# Patient Record
Sex: Male | Born: 1940 | Race: White | Hispanic: No | Marital: Married | Smoking: Former smoker
Health system: Southern US, Community
[De-identification: ages and names within clinical notes are randomized; demographics above are authoritative.]

---

## 2011-10-17 ENCOUNTER — Other Ambulatory Visit: Payer: Self-pay | Admitting: Neurological Surgery

## 2011-10-17 DIAGNOSIS — M542 Cervicalgia: Secondary | ICD-10-CM

## 2011-10-24 ENCOUNTER — Ambulatory Visit
Admission: RE | Admit: 2011-10-24 | Discharge: 2011-10-24 | Disposition: A | Discharge status: Home / Self Care | Payer: Medicare Other | Source: Ambulatory Visit | Attending: Neurological Surgery | Admitting: Neurological Surgery

## 2011-10-24 VITALS — BP 137/72 | HR 80 | Ht 65.0 in | Wt 195.0 lb

## 2011-10-24 DIAGNOSIS — M542 Cervicalgia: Secondary | ICD-10-CM

## 2011-10-24 MED ORDER — ONDANSETRON HCL 4 MG/2ML IJ SOLN
4.0000 mg | Freq: Once | INTRAMUSCULAR | Status: AC
  Administered 2011-10-24: 4 mg via INTRAMUSCULAR

## 2011-10-24 MED ORDER — MEPERIDINE HCL 100 MG/ML IJ SOLN
75.0000 mg | Freq: Once | INTRAMUSCULAR | Status: AC
  Administered 2011-10-24: 50 mg via INTRAMUSCULAR

## 2011-10-24 MED ORDER — IOHEXOL 300 MG/ML  SOLN
10.0000 mL | Freq: Once | INTRAMUSCULAR | Status: AC | PRN
  Administered 2011-10-24: 10 mL via INTRATHECAL

## 2011-10-24 MED ORDER — MEPERIDINE HCL 100 MG/ML IJ SOLN
75.0000 mg | Freq: Once | INTRAMUSCULAR | Status: AC
  Administered 2011-10-24: 75 mg via INTRAMUSCULAR

## 2011-10-24 NOTE — Progress Notes (Signed)
Pt states he has been off trazadone for the past 2 days.

## 2011-10-25 ENCOUNTER — Telehealth: Payer: Self-pay | Admitting: Radiology

## 2011-10-25 NOTE — Telephone Encounter (Signed)
Wife called because pt is weak and lethargic, wife gave pt his trazadone last evening even though it was to be held. Pt also receives large doses of morphine and hydrocodone around the clock. Explained this seemed to be a med problem not a myelogram problem.

## 2012-07-06 DIAGNOSIS — C679 Malignant neoplasm of bladder, unspecified: Secondary | ICD-10-CM | POA: Insufficient documentation

## 2013-02-12 DIAGNOSIS — Z515 Encounter for palliative care: Secondary | ICD-10-CM | POA: Insufficient documentation

## 2013-08-06 DIAGNOSIS — M4310 Spondylolisthesis, site unspecified: Secondary | ICD-10-CM | POA: Insufficient documentation

## 2013-08-06 DIAGNOSIS — J309 Allergic rhinitis, unspecified: Secondary | ICD-10-CM | POA: Insufficient documentation

## 2013-08-06 DIAGNOSIS — E669 Obesity, unspecified: Secondary | ICD-10-CM | POA: Insufficient documentation

## 2013-08-06 DIAGNOSIS — I878 Other specified disorders of veins: Secondary | ICD-10-CM | POA: Insufficient documentation

## 2013-08-06 DIAGNOSIS — J45901 Unspecified asthma with (acute) exacerbation: Secondary | ICD-10-CM | POA: Insufficient documentation

## 2013-08-06 DIAGNOSIS — M19019 Primary osteoarthritis, unspecified shoulder: Secondary | ICD-10-CM | POA: Insufficient documentation

## 2013-08-06 DIAGNOSIS — R490 Dysphonia: Secondary | ICD-10-CM | POA: Insufficient documentation

## 2013-08-06 DIAGNOSIS — R7309 Other abnormal glucose: Secondary | ICD-10-CM | POA: Insufficient documentation

## 2013-08-06 DIAGNOSIS — M19079 Primary osteoarthritis, unspecified ankle and foot: Secondary | ICD-10-CM | POA: Insufficient documentation

## 2013-08-06 DIAGNOSIS — R0902 Hypoxemia: Secondary | ICD-10-CM | POA: Insufficient documentation

## 2013-08-06 DIAGNOSIS — R310 Gross hematuria: Secondary | ICD-10-CM | POA: Insufficient documentation

## 2013-08-06 DIAGNOSIS — M5412 Radiculopathy, cervical region: Secondary | ICD-10-CM | POA: Insufficient documentation

## 2013-08-06 DIAGNOSIS — M2040 Other hammer toe(s) (acquired), unspecified foot: Secondary | ICD-10-CM | POA: Insufficient documentation

## 2013-08-06 DIAGNOSIS — M659 Synovitis and tenosynovitis, unspecified: Secondary | ICD-10-CM | POA: Insufficient documentation

## 2013-08-06 DIAGNOSIS — G9612 Meningeal adhesions (cerebral) (spinal): Secondary | ICD-10-CM | POA: Insufficient documentation

## 2013-08-06 DIAGNOSIS — G894 Chronic pain syndrome: Secondary | ICD-10-CM | POA: Insufficient documentation

## 2013-08-06 DIAGNOSIS — K219 Gastro-esophageal reflux disease without esophagitis: Secondary | ICD-10-CM | POA: Insufficient documentation

## 2013-08-06 DIAGNOSIS — H903 Sensorineural hearing loss, bilateral: Secondary | ICD-10-CM | POA: Insufficient documentation

## 2013-08-06 DIAGNOSIS — S43003A Unspecified subluxation of unspecified shoulder joint, initial encounter: Secondary | ICD-10-CM | POA: Insufficient documentation

## 2013-08-06 DIAGNOSIS — M1991 Primary osteoarthritis, unspecified site: Secondary | ICD-10-CM | POA: Insufficient documentation

## 2013-08-26 DIAGNOSIS — M5414 Radiculopathy, thoracic region: Secondary | ICD-10-CM | POA: Insufficient documentation

## 2013-08-26 DIAGNOSIS — M5417 Radiculopathy, lumbosacral region: Secondary | ICD-10-CM

## 2013-10-03 DIAGNOSIS — M47816 Spondylosis without myelopathy or radiculopathy, lumbar region: Secondary | ICD-10-CM | POA: Insufficient documentation

## 2013-10-03 DIAGNOSIS — M533 Sacrococcygeal disorders, not elsewhere classified: Secondary | ICD-10-CM | POA: Insufficient documentation

## 2013-10-03 DIAGNOSIS — M5136 Other intervertebral disc degeneration, lumbar region: Secondary | ICD-10-CM | POA: Insufficient documentation

## 2013-10-09 DIAGNOSIS — R8289 Other abnormal findings on cytological and histological examination of urine: Secondary | ICD-10-CM | POA: Insufficient documentation

## 2013-10-09 DIAGNOSIS — N329 Bladder disorder, unspecified: Secondary | ICD-10-CM | POA: Insufficient documentation

## 2013-10-18 DIAGNOSIS — S3210XA Unspecified fracture of sacrum, initial encounter for closed fracture: Secondary | ICD-10-CM | POA: Insufficient documentation

## 2013-10-19 DIAGNOSIS — K5900 Constipation, unspecified: Secondary | ICD-10-CM | POA: Insufficient documentation

## 2013-10-19 DIAGNOSIS — J159 Unspecified bacterial pneumonia: Secondary | ICD-10-CM | POA: Insufficient documentation

## 2013-10-19 DIAGNOSIS — L89159 Pressure ulcer of sacral region, unspecified stage: Secondary | ICD-10-CM | POA: Insufficient documentation

## 2013-10-19 DIAGNOSIS — E876 Hypokalemia: Secondary | ICD-10-CM | POA: Insufficient documentation

## 2013-10-19 DIAGNOSIS — S51809A Unspecified open wound of unspecified forearm, initial encounter: Secondary | ICD-10-CM | POA: Insufficient documentation

## 2013-10-19 DIAGNOSIS — R600 Localized edema: Secondary | ICD-10-CM | POA: Insufficient documentation

## 2013-10-19 DIAGNOSIS — S90819A Abrasion, unspecified foot, initial encounter: Secondary | ICD-10-CM | POA: Insufficient documentation

## 2013-10-19 DIAGNOSIS — M25559 Pain in unspecified hip: Secondary | ICD-10-CM | POA: Insufficient documentation

## 2013-10-19 DIAGNOSIS — D619 Aplastic anemia, unspecified: Secondary | ICD-10-CM | POA: Insufficient documentation

## 2013-10-19 DIAGNOSIS — R413 Other amnesia: Secondary | ICD-10-CM | POA: Insufficient documentation

## 2013-11-21 ENCOUNTER — Other Ambulatory Visit: Payer: Self-pay | Admitting: Neurological Surgery

## 2013-11-21 DIAGNOSIS — S3210XG Unspecified fracture of sacrum, subsequent encounter for fracture with delayed healing: Secondary | ICD-10-CM

## 2013-11-25 ENCOUNTER — Inpatient Hospital Stay: Admission: RE | Admit: 2013-11-25 | Payer: Medicare Other | Source: Ambulatory Visit

## 2013-12-10 ENCOUNTER — Ambulatory Visit
Admission: RE | Admit: 2013-12-10 | Discharge: 2013-12-10 | Disposition: A | Discharge status: Home / Self Care | Payer: Medicare Other | Source: Ambulatory Visit | Attending: Neurological Surgery | Admitting: Neurological Surgery

## 2013-12-10 DIAGNOSIS — S3210XG Unspecified fracture of sacrum, subsequent encounter for fracture with delayed healing: Secondary | ICD-10-CM

## 2013-12-13 ENCOUNTER — Other Ambulatory Visit: Payer: Self-pay | Admitting: Neurological Surgery

## 2013-12-13 DIAGNOSIS — S3210XK Unspecified fracture of sacrum, subsequent encounter for fracture with nonunion: Secondary | ICD-10-CM

## 2013-12-16 DIAGNOSIS — G8929 Other chronic pain: Secondary | ICD-10-CM | POA: Insufficient documentation

## 2013-12-16 DIAGNOSIS — J449 Chronic obstructive pulmonary disease, unspecified: Secondary | ICD-10-CM | POA: Insufficient documentation

## 2013-12-16 DIAGNOSIS — M549 Dorsalgia, unspecified: Secondary | ICD-10-CM

## 2013-12-19 ENCOUNTER — Ambulatory Visit
Admission: RE | Admit: 2013-12-19 | Discharge: 2013-12-19 | Disposition: A | Discharge status: Home / Self Care | Payer: Medicare Other | Source: Ambulatory Visit | Attending: Neurological Surgery | Admitting: Neurological Surgery

## 2013-12-19 VITALS — BP 148/84 | HR 72 | Temp 98.1°F | Resp 16

## 2013-12-19 DIAGNOSIS — S3210XG Unspecified fracture of sacrum, subsequent encounter for fracture with delayed healing: Secondary | ICD-10-CM

## 2013-12-19 MED ORDER — SODIUM CHLORIDE 0.9 % IV SOLN
Freq: Once | INTRAVENOUS | Status: AC
  Administered 2013-12-19: 08:00:00 via INTRAVENOUS

## 2013-12-19 MED ORDER — CEFAZOLIN SODIUM-DEXTROSE 2-3 GM-% IV SOLR
2.0000 g | Freq: Once | INTRAVENOUS | Status: AC
  Administered 2013-12-19: 2 g via INTRAVENOUS

## 2013-12-19 MED ORDER — FENTANYL CITRATE 0.05 MG/ML IJ SOLN
25.0000 ug | INTRAMUSCULAR | Status: DC | PRN
  Administered 2013-12-19 (×2): 25 ug via INTRAVENOUS

## 2013-12-19 MED ORDER — MIDAZOLAM HCL 2 MG/2ML IJ SOLN
1.0000 mg | INTRAMUSCULAR | Status: DC | PRN
  Administered 2013-12-19 (×2): 0.5 mg via INTRAVENOUS

## 2013-12-19 MED ORDER — KETOROLAC TROMETHAMINE 30 MG/ML IJ SOLN: 30.0000 mg | Freq: Once | INTRAMUSCULAR | Status: DC

## 2013-12-19 NOTE — Progress Notes (Signed)
Pt lungs are coarse thru out due to COPD. Sat on room air 91%. Skin warm and dry.

## 2013-12-19 NOTE — Discharge Instructions (Addendum)
Sacroplasty Post Procedure Discharge Instructions  1. May resume a regular diet and any medications that you routinely take (including pain medications). 2. No driving day of procedure. 3. Upon discharge go home and rest for at least 4 hours.  May use an ice pack as needed to injection sites on back. 4. Remove bandades after shower tomorrow and replace with bandaides daily until healed.    Please contact our office at [phone] for the following symptoms:   Fever greater than 100 degrees  Increased swelling, pain, or redness at injection site.   Thank you for visiting [facility].

## 2013-12-19 NOTE — Progress Notes (Signed)
Wife at bedside, Dr. Jobe Igo in to speak to pt and wife

## 2014-01-09 ENCOUNTER — Other Ambulatory Visit: Payer: Self-pay | Admitting: Neurological Surgery

## 2014-01-09 DIAGNOSIS — S3210XB Unspecified fracture of sacrum, initial encounter for open fracture: Secondary | ICD-10-CM

## 2014-01-22 ENCOUNTER — Other Ambulatory Visit: Payer: Medicare Other

## 2014-02-19 ENCOUNTER — Inpatient Hospital Stay
Admission: AD | Admit: 2014-02-19 | Discharge: 2014-02-28 | Disposition: E | Payer: Self-pay | Source: Ambulatory Visit | Attending: Internal Medicine | Admitting: Internal Medicine

## 2014-02-19 ENCOUNTER — Other Ambulatory Visit (HOSPITAL_COMMUNITY): Payer: Self-pay

## 2014-02-19 DIAGNOSIS — N179 Acute kidney failure, unspecified: Secondary | ICD-10-CM

## 2014-02-19 DIAGNOSIS — J111 Influenza due to unidentified influenza virus with other respiratory manifestations: Secondary | ICD-10-CM

## 2014-02-19 DIAGNOSIS — Z4659 Encounter for fitting and adjustment of other gastrointestinal appliance and device: Secondary | ICD-10-CM

## 2014-02-19 DIAGNOSIS — J9690 Respiratory failure, unspecified, unspecified whether with hypoxia or hypercapnia: Secondary | ICD-10-CM

## 2014-02-19 LAB — PROCALCITONIN: PROCALCITONIN: 1.79 ng/mL

## 2014-02-20 ENCOUNTER — Other Ambulatory Visit (HOSPITAL_COMMUNITY): Payer: Self-pay

## 2014-02-20 LAB — URINALYSIS, ROUTINE W REFLEX MICROSCOPIC
Bilirubin Urine: NEGATIVE
Glucose, UA: NEGATIVE mg/dL
Ketones, ur: NEGATIVE mg/dL
Nitrite: NEGATIVE
PROTEIN: 100 mg/dL — AB
SPECIFIC GRAVITY, URINE: 1.016 (ref 1.005–1.030)
UROBILINOGEN UA: 0.2 mg/dL (ref 0.0–1.0)
pH: 5.5 (ref 5.0–8.0)

## 2014-02-20 LAB — URINE MICROSCOPIC-ADD ON

## 2014-02-20 LAB — COMPREHENSIVE METABOLIC PANEL
ALK PHOS: 65 U/L (ref 39–117)
ALT: 47 U/L (ref 0–53)
AST: 37 U/L (ref 0–37)
Albumin: 2.3 g/dL — ABNORMAL LOW (ref 3.5–5.2)
Anion gap: 14 (ref 5–15)
BILIRUBIN TOTAL: 0.4 mg/dL (ref 0.3–1.2)
BUN: 78 mg/dL — AB (ref 6–23)
CHLORIDE: 105 meq/L (ref 96–112)
CO2: 19 mmol/L (ref 19–32)
Calcium: 8.6 mg/dL (ref 8.4–10.5)
Creatinine, Ser: 4.23 mg/dL — ABNORMAL HIGH (ref 0.50–1.35)
GFR calc Af Amer: 15 mL/min — ABNORMAL LOW (ref >90)
GFR calc non Af Amer: 13 mL/min — ABNORMAL LOW (ref >90)
Glucose, Bld: 111 mg/dL — ABNORMAL HIGH (ref 70–99)
POTASSIUM: 4.6 mmol/L (ref 3.5–5.1)
SODIUM: 138 mmol/L (ref 135–145)
Total Protein: 6.6 g/dL (ref 6.0–8.3)

## 2014-02-20 LAB — CBC WITH DIFFERENTIAL/PLATELET
BASOS PCT: 1 % (ref 0–1)
Basophils Absolute: 0.1 10*3/uL (ref 0.0–0.1)
EOS ABS: 0.1 10*3/uL (ref 0.0–0.7)
Eosinophils Relative: 2 % (ref 0–5)
HCT: 33.5 % — ABNORMAL LOW (ref 39.0–52.0)
Hemoglobin: 10.8 g/dL — ABNORMAL LOW (ref 13.0–17.0)
LYMPHS ABS: 0.9 10*3/uL (ref 0.7–4.0)
Lymphocytes Relative: 10 % — ABNORMAL LOW (ref 12–46)
MCH: 28.7 pg (ref 26.0–34.0)
MCHC: 32.2 g/dL (ref 30.0–36.0)
MCV: 89.1 fL (ref 78.0–100.0)
MONOS PCT: 20 % — AB (ref 3–12)
Monocytes Absolute: 1.8 10*3/uL — ABNORMAL HIGH (ref 0.1–1.0)
NEUTROS ABS: 6 10*3/uL (ref 1.7–7.7)
NEUTROS PCT: 67 % (ref 43–77)
PLATELETS: 435 10*3/uL — AB (ref 150–400)
RBC: 3.76 MIL/uL — ABNORMAL LOW (ref 4.22–5.81)
RDW: 14.9 % (ref 11.5–15.5)
WBC: 8.9 10*3/uL (ref 4.0–10.5)

## 2014-02-20 LAB — C-REACTIVE PROTEIN: CRP: 16.4 mg/dL — ABNORMAL HIGH (ref <0.60)

## 2014-02-20 LAB — LIPID PANEL
CHOL/HDL RATIO: 3.8 ratio
CHOLESTEROL: 157 mg/dL (ref 0–200)
HDL: 41 mg/dL (ref >39)
LDL Cholesterol: 95 mg/dL (ref 0–99)
TRIGLYCERIDES: 104 mg/dL (ref <150)
VLDL: 21 mg/dL (ref 0–40)

## 2014-02-20 LAB — CK: CK TOTAL: 22 U/L (ref 7–232)

## 2014-02-20 LAB — VITAMIN B12: VITAMIN B 12: 853 pg/mL (ref 211–911)

## 2014-02-20 LAB — T4, FREE: Free T4: 0.83 ng/dL (ref 0.80–1.80)

## 2014-02-20 LAB — BRAIN NATRIURETIC PEPTIDE: B NATRIURETIC PEPTIDE 5: 160.6 pg/mL — AB (ref 0.0–100.0)

## 2014-02-20 LAB — PROTIME-INR
INR: 1.23 (ref 0.00–1.49)
Prothrombin Time: 15.7 seconds — ABNORMAL HIGH (ref 11.6–15.2)

## 2014-02-20 LAB — PHOSPHORUS: Phosphorus: 5.5 mg/dL — ABNORMAL HIGH (ref 2.3–4.6)

## 2014-02-20 LAB — TSH: TSH: 1.282 u[IU]/mL (ref 0.350–4.500)

## 2014-02-20 LAB — SEDIMENTATION RATE: SED RATE: 92 mm/h — AB (ref 0–16)

## 2014-02-20 LAB — MAGNESIUM: Magnesium: 2.1 mg/dL (ref 1.5–2.5)

## 2014-02-20 LAB — FERRITIN: Ferritin: 717 ng/mL — ABNORMAL HIGH (ref 22–322)

## 2014-02-21 ENCOUNTER — Other Ambulatory Visit (HOSPITAL_COMMUNITY): Payer: Self-pay

## 2014-02-21 LAB — RENAL FUNCTION PANEL
ALBUMIN: 2.4 g/dL — AB (ref 3.5–5.2)
Anion gap: 15 (ref 5–15)
BUN: 88 mg/dL — ABNORMAL HIGH (ref 6–23)
CO2: 19 mmol/L (ref 19–32)
Calcium: 8.8 mg/dL (ref 8.4–10.5)
Chloride: 107 mEq/L (ref 96–112)
Creatinine, Ser: 4.96 mg/dL — ABNORMAL HIGH (ref 0.50–1.35)
GFR, EST AFRICAN AMERICAN: 12 mL/min — AB (ref >90)
GFR, EST NON AFRICAN AMERICAN: 11 mL/min — AB (ref >90)
Glucose, Bld: 107 mg/dL — ABNORMAL HIGH (ref 70–99)
Phosphorus: 6.1 mg/dL — ABNORMAL HIGH (ref 2.3–4.6)
Potassium: 5 mmol/L (ref 3.5–5.1)
Sodium: 141 mmol/L (ref 135–145)

## 2014-02-21 LAB — HEMOGLOBIN A1C
Hgb A1c MFr Bld: 5.9 % — ABNORMAL HIGH (ref <5.7)
Mean Plasma Glucose: 123 mg/dL — ABNORMAL HIGH (ref <117)

## 2014-02-21 LAB — CBC
HCT: 31.7 % — ABNORMAL LOW (ref 39.0–52.0)
Hemoglobin: 9.8 g/dL — ABNORMAL LOW (ref 13.0–17.0)
MCH: 28.3 pg (ref 26.0–34.0)
MCHC: 30.9 g/dL (ref 30.0–36.0)
MCV: 91.6 fL (ref 78.0–100.0)
PLATELETS: 436 10*3/uL — AB (ref 150–400)
RBC: 3.46 MIL/uL — AB (ref 4.22–5.81)
RDW: 15.3 % (ref 11.5–15.5)
WBC: 9.4 10*3/uL (ref 4.0–10.5)

## 2014-02-21 LAB — URINE CULTURE
CULTURE: NO GROWTH
Colony Count: NO GROWTH

## 2014-02-21 LAB — PROCALCITONIN: PROCALCITONIN: 1.71 ng/mL

## 2014-02-22 ENCOUNTER — Other Ambulatory Visit (HOSPITAL_COMMUNITY): Payer: Self-pay

## 2014-02-22 LAB — RENAL FUNCTION PANEL
ALBUMIN: 2.4 g/dL — AB (ref 3.5–5.2)
Anion gap: 13 (ref 5–15)
BUN: 96 mg/dL — ABNORMAL HIGH (ref 6–23)
CALCIUM: 8.6 mg/dL (ref 8.4–10.5)
CO2: 21 mmol/L (ref 19–32)
Chloride: 107 mEq/L (ref 96–112)
Creatinine, Ser: 5.8 mg/dL — ABNORMAL HIGH (ref 0.50–1.35)
GFR, EST AFRICAN AMERICAN: 10 mL/min — AB (ref >90)
GFR, EST NON AFRICAN AMERICAN: 9 mL/min — AB (ref >90)
Glucose, Bld: 134 mg/dL — ABNORMAL HIGH (ref 70–99)
PHOSPHORUS: 6.7 mg/dL — AB (ref 2.3–4.6)
Potassium: 5.1 mmol/L (ref 3.5–5.1)
SODIUM: 141 mmol/L (ref 135–145)

## 2014-02-22 LAB — C4 COMPLEMENT: COMPLEMENT C4, BODY FLUID: 27 mg/dL (ref 10–40)

## 2014-02-22 LAB — FOLATE RBC

## 2014-02-22 LAB — C3 COMPLEMENT: C3 Complement: 101 mg/dL (ref 90–180)

## 2014-02-23 LAB — RENAL FUNCTION PANEL
Albumin: 2.2 g/dL — ABNORMAL LOW (ref 3.5–5.2)
Anion gap: 10 (ref 5–15)
BUN: 104 mg/dL — ABNORMAL HIGH (ref 6–23)
CHLORIDE: 112 meq/L (ref 96–112)
CO2: 21 mmol/L (ref 19–32)
CREATININE: 6.1 mg/dL — AB (ref 0.50–1.35)
Calcium: 8.8 mg/dL (ref 8.4–10.5)
GFR calc Af Amer: 10 mL/min — ABNORMAL LOW (ref >90)
GFR calc non Af Amer: 8 mL/min — ABNORMAL LOW (ref >90)
GLUCOSE: 165 mg/dL — AB (ref 70–99)
PHOSPHORUS: 7.3 mg/dL — AB (ref 2.3–4.6)
POTASSIUM: 5 mmol/L (ref 3.5–5.1)
Sodium: 143 mmol/L (ref 135–145)

## 2014-02-23 LAB — CBC WITH DIFFERENTIAL/PLATELET
BASOS PCT: 0 % (ref 0–1)
Basophils Absolute: 0 10*3/uL (ref 0.0–0.1)
Eosinophils Absolute: 0 10*3/uL (ref 0.0–0.7)
Eosinophils Relative: 0 % (ref 0–5)
HCT: 30.5 % — ABNORMAL LOW (ref 39.0–52.0)
Hemoglobin: 9.8 g/dL — ABNORMAL LOW (ref 13.0–17.0)
LYMPHS PCT: 7 % — AB (ref 12–46)
Lymphs Abs: 0.9 10*3/uL (ref 0.7–4.0)
MCH: 29.5 pg (ref 26.0–34.0)
MCHC: 32.1 g/dL (ref 30.0–36.0)
MCV: 91.9 fL (ref 78.0–100.0)
MONO ABS: 2.2 10*3/uL — AB (ref 0.1–1.0)
MONOS PCT: 17 % — AB (ref 3–12)
Neutro Abs: 10 10*3/uL — ABNORMAL HIGH (ref 1.7–7.7)
Neutrophils Relative %: 76 % (ref 43–77)
PLATELETS: 485 10*3/uL — AB (ref 150–400)
RBC: 3.32 MIL/uL — ABNORMAL LOW (ref 4.22–5.81)
RDW: 15.3 % (ref 11.5–15.5)
WBC: 13.1 10*3/uL — AB (ref 4.0–10.5)

## 2014-02-23 LAB — PROCALCITONIN: PROCALCITONIN: 1.92 ng/mL

## 2014-02-24 LAB — RENAL FUNCTION PANEL
Albumin: 2 g/dL — ABNORMAL LOW (ref 3.5–5.2)
Anion gap: 10 (ref 5–15)
BUN: 121 mg/dL — AB (ref 6–23)
CO2: 19 mmol/L (ref 19–32)
Calcium: 8.6 mg/dL (ref 8.4–10.5)
Chloride: 116 mEq/L — ABNORMAL HIGH (ref 96–112)
Creatinine, Ser: 7.1 mg/dL — ABNORMAL HIGH (ref 0.50–1.35)
GFR calc Af Amer: 8 mL/min — ABNORMAL LOW (ref >90)
GFR calc non Af Amer: 7 mL/min — ABNORMAL LOW (ref >90)
GLUCOSE: 135 mg/dL — AB (ref 70–99)
PHOSPHORUS: 10.7 mg/dL — AB (ref 2.3–4.6)
POTASSIUM: 6.9 mmol/L — AB (ref 3.5–5.1)
Sodium: 145 mmol/L (ref 135–145)

## 2014-02-24 LAB — CBC
HEMATOCRIT: 30 % — AB (ref 39.0–52.0)
Hemoglobin: 9.2 g/dL — ABNORMAL LOW (ref 13.0–17.0)
MCH: 29.7 pg (ref 26.0–34.0)
MCHC: 30.7 g/dL (ref 30.0–36.0)
MCV: 96.8 fL (ref 78.0–100.0)
Platelets: 630 10*3/uL — ABNORMAL HIGH (ref 150–400)
RBC: 3.1 MIL/uL — ABNORMAL LOW (ref 4.22–5.81)
RDW: 15.7 % — ABNORMAL HIGH (ref 11.5–15.5)
WBC: 20.8 10*3/uL — ABNORMAL HIGH (ref 4.0–10.5)

## 2014-02-24 LAB — MPO/PR-3 (ANCA) ANTIBODIES
Myeloperoxidase Abs: <1
Serine Protease 3: <1

## 2014-02-24 LAB — CK: CK TOTAL: 25 U/L (ref 7–232)

## 2014-02-24 LAB — ANA: Anti Nuclear Antibody(ANA): NEGATIVE

## 2014-02-24 LAB — GLOMERULAR BASEMENT MEMBRANE ANTIBODIES: GBM Ab: <1

## 2014-02-25 LAB — PROTEIN ELECTROPHORESIS, SERUM
Albumin ELP: 40.5 % — ABNORMAL LOW (ref 55.8–66.1)
Alpha-1-Globulin: 9.6 % — ABNORMAL HIGH (ref 2.9–4.9)
Alpha-2-Globulin: 17.5 % — ABNORMAL HIGH (ref 7.1–11.8)
BETA 2: 5.7 % (ref 3.2–6.5)
Beta Globulin: 6.7 % (ref 4.7–7.2)
GAMMA GLOBULIN: 20 % — AB (ref 11.1–18.8)
M-SPIKE, %: NOT DETECTED g/dL
Total Protein ELP: 6.4 g/dL (ref 6.0–8.3)

## 2014-02-28 DEATH — deceased

## 2014-03-02 LAB — CULTURE, BLOOD (ROUTINE X 2)
CULTURE: NO GROWTH
Culture: NO GROWTH

## 2016-03-25 IMAGING — US US RENAL
1 series · 14 of 16 positions shown · non-contrast
Comparison: Renal ultrasound performed 02/17/2014

CLINICAL DATA: Acute onset of renal failure.  Initial encounter.

EXAM:
RENAL/URINARY TRACT ULTRASOUND COMPLETE

[Series 1: us renal · 0.23mm/px · 14 of 16 slices shown]
[im 1/16]
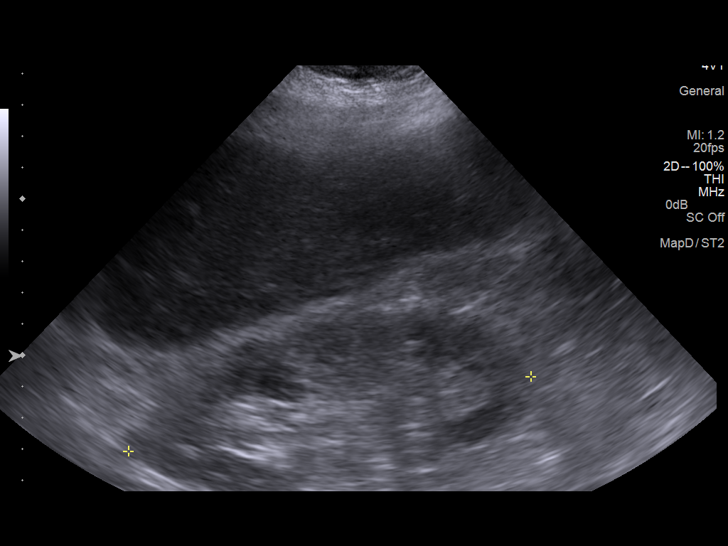
[im 2/16]
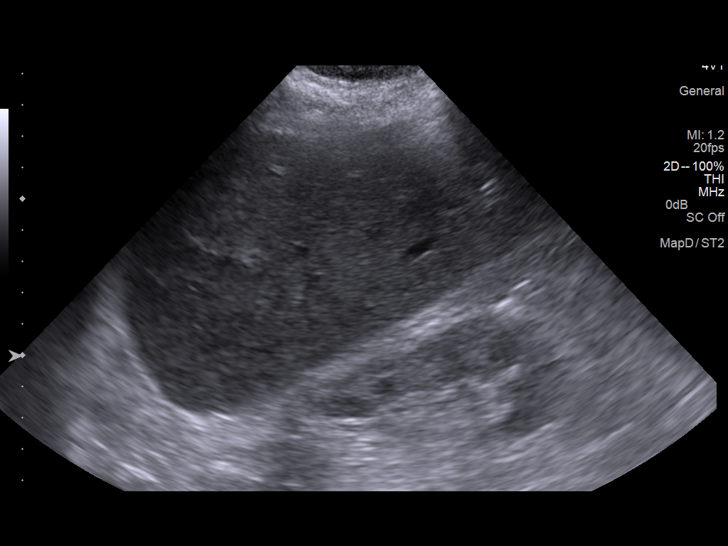
[im 3/16]
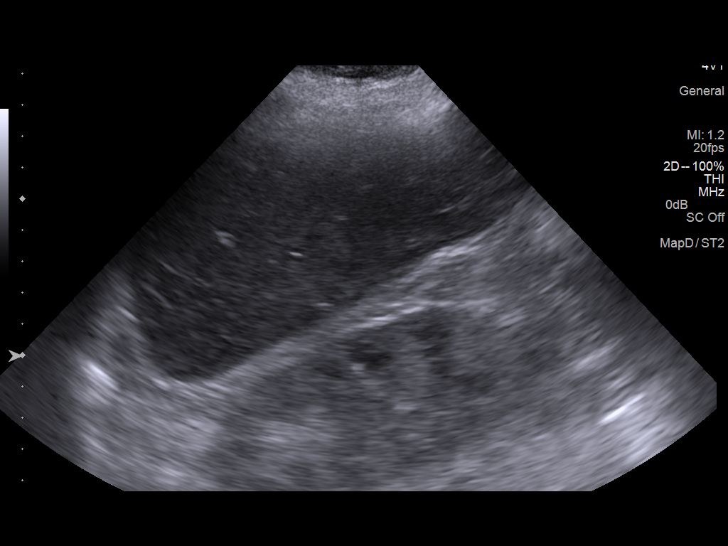
[im 5/16]
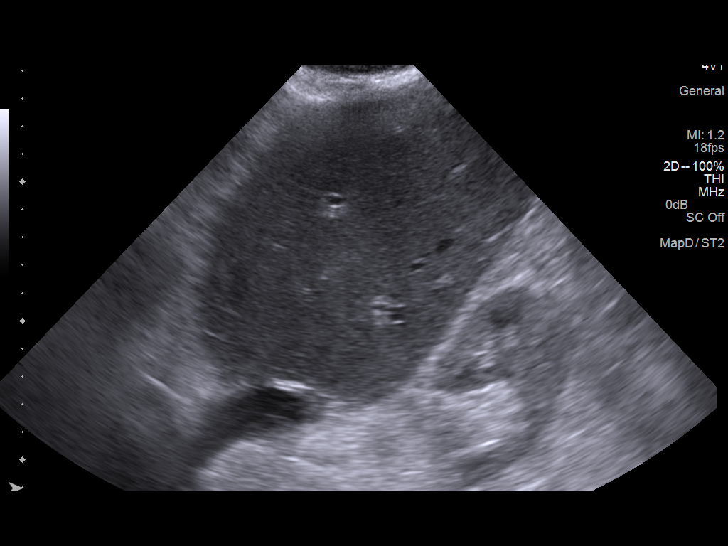
[im 6/16]
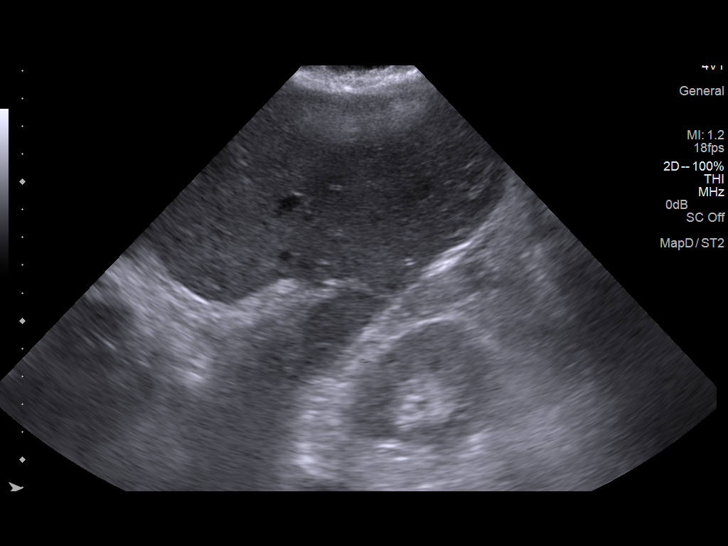
[im 7/16]
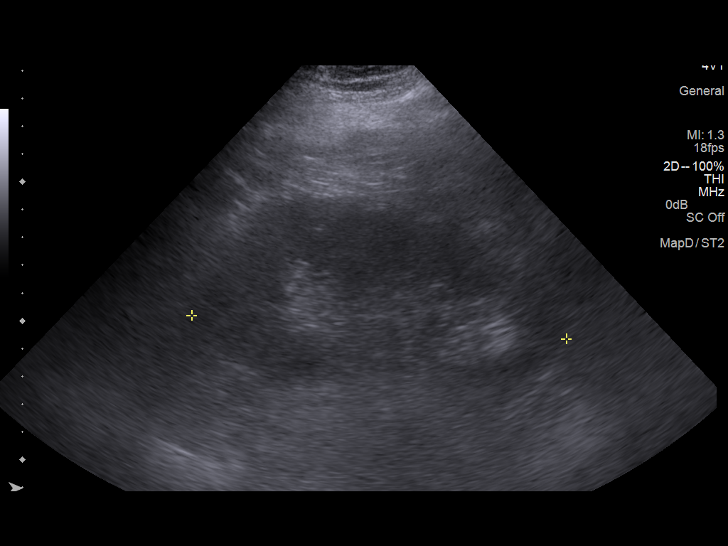
[im 8/16]
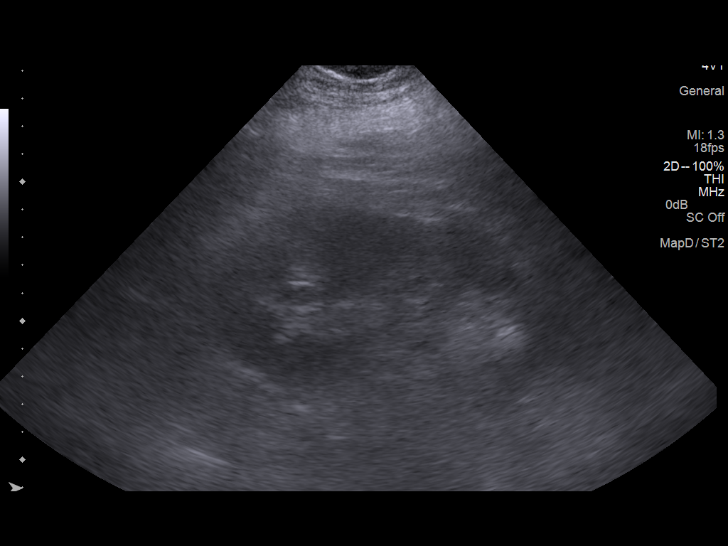
[im 9/16]
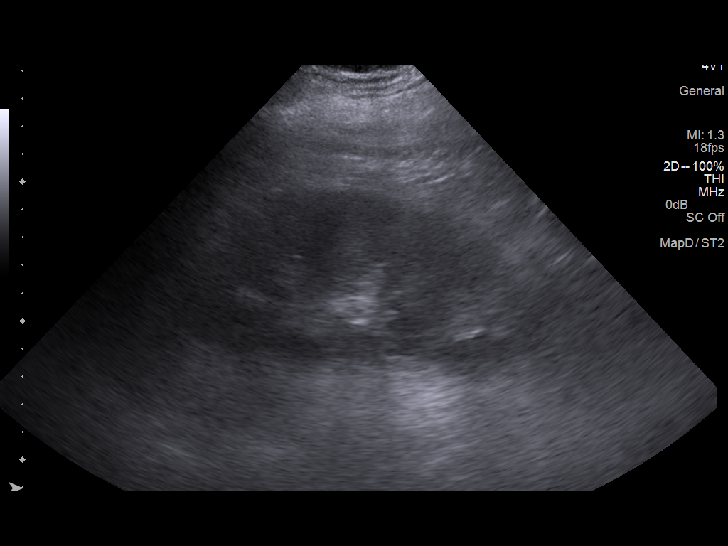
[im 10/16]
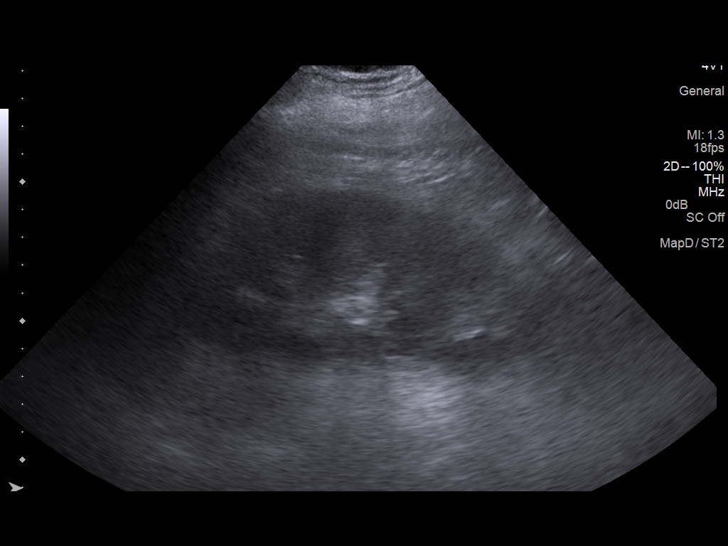
[im 11/16]
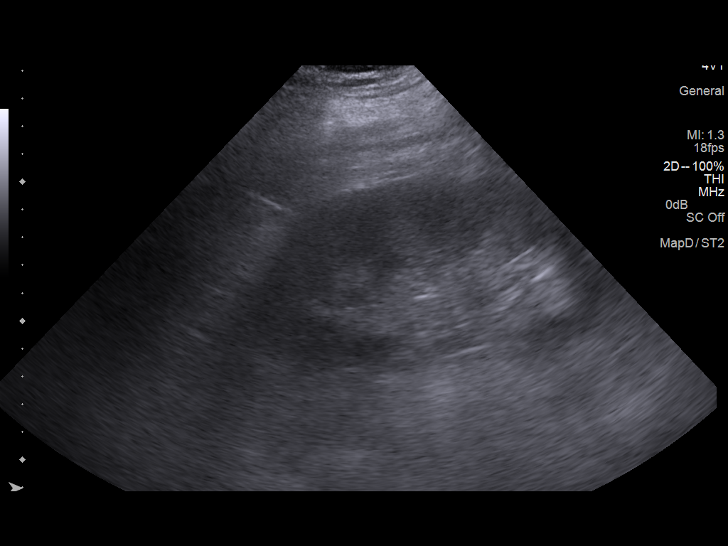
[im 13/16]
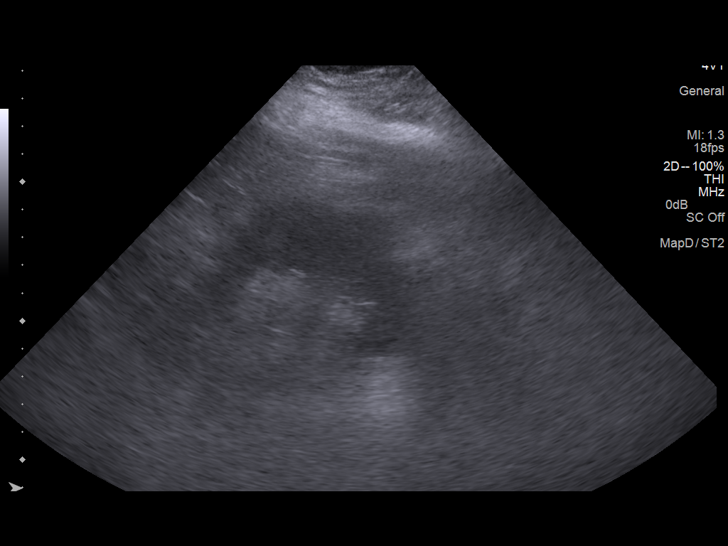
[im 14/16]
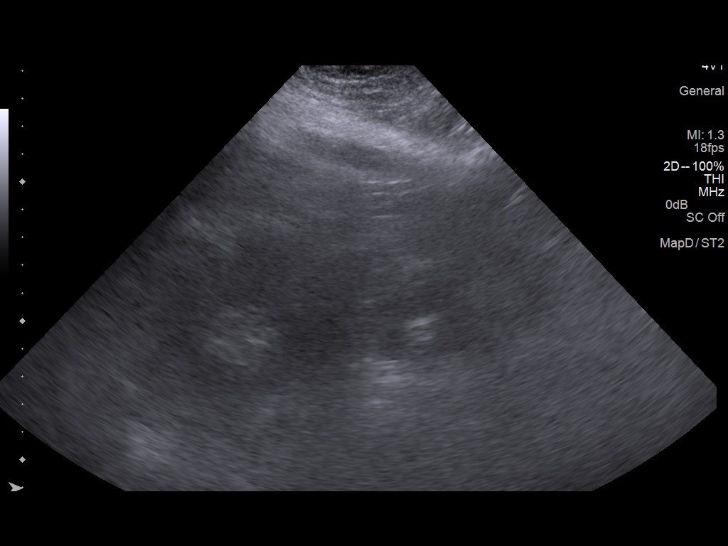
[im 15/16]
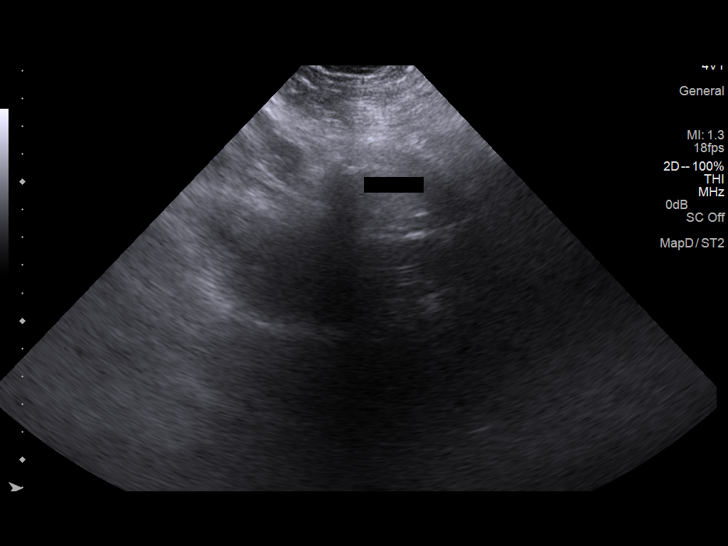
[im 16/16]
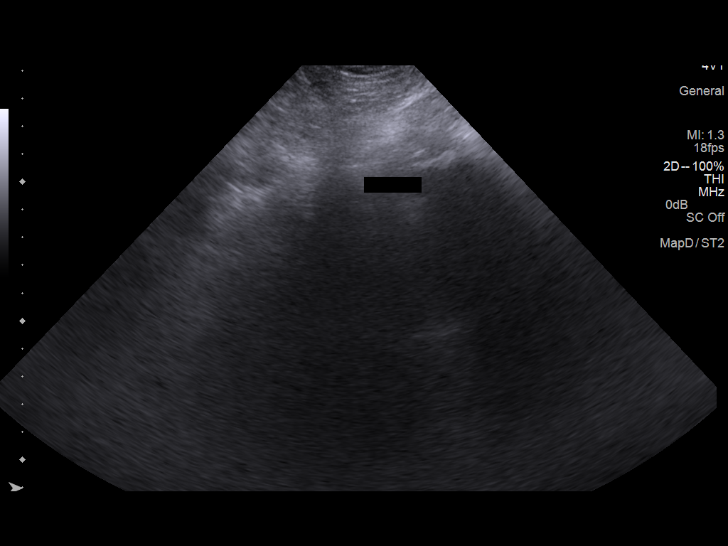

[14 of 16 positions shown; findings below may reference images not displayed]

FINDINGS: Right Kidney:

Length: 13.1 cm. Diffusely increased parenchymal echogenicity noted.
No mass or hydronephrosis visualized.

Left Kidney:

Length: 13.5 cm. Diffusely increased parenchymal echogenicity noted.
No mass or hydronephrosis visualized.

Bladder:

Decompressed, with a Foley catheter in place.
IMPRESSION: Diffusely increased renal parenchymal echogenicity raises concern
for medical renal disease. No evidence of hydronephrosis.

## 2016-03-26 IMAGING — CR DG CHEST 1V PORT
1 series · 1 of 1 positions shown · non-contrast
Comparison: 02/19/2014

CLINICAL DATA: Pneumonia

EXAM:
PORTABLE CHEST - 1 VIEW

[AP]
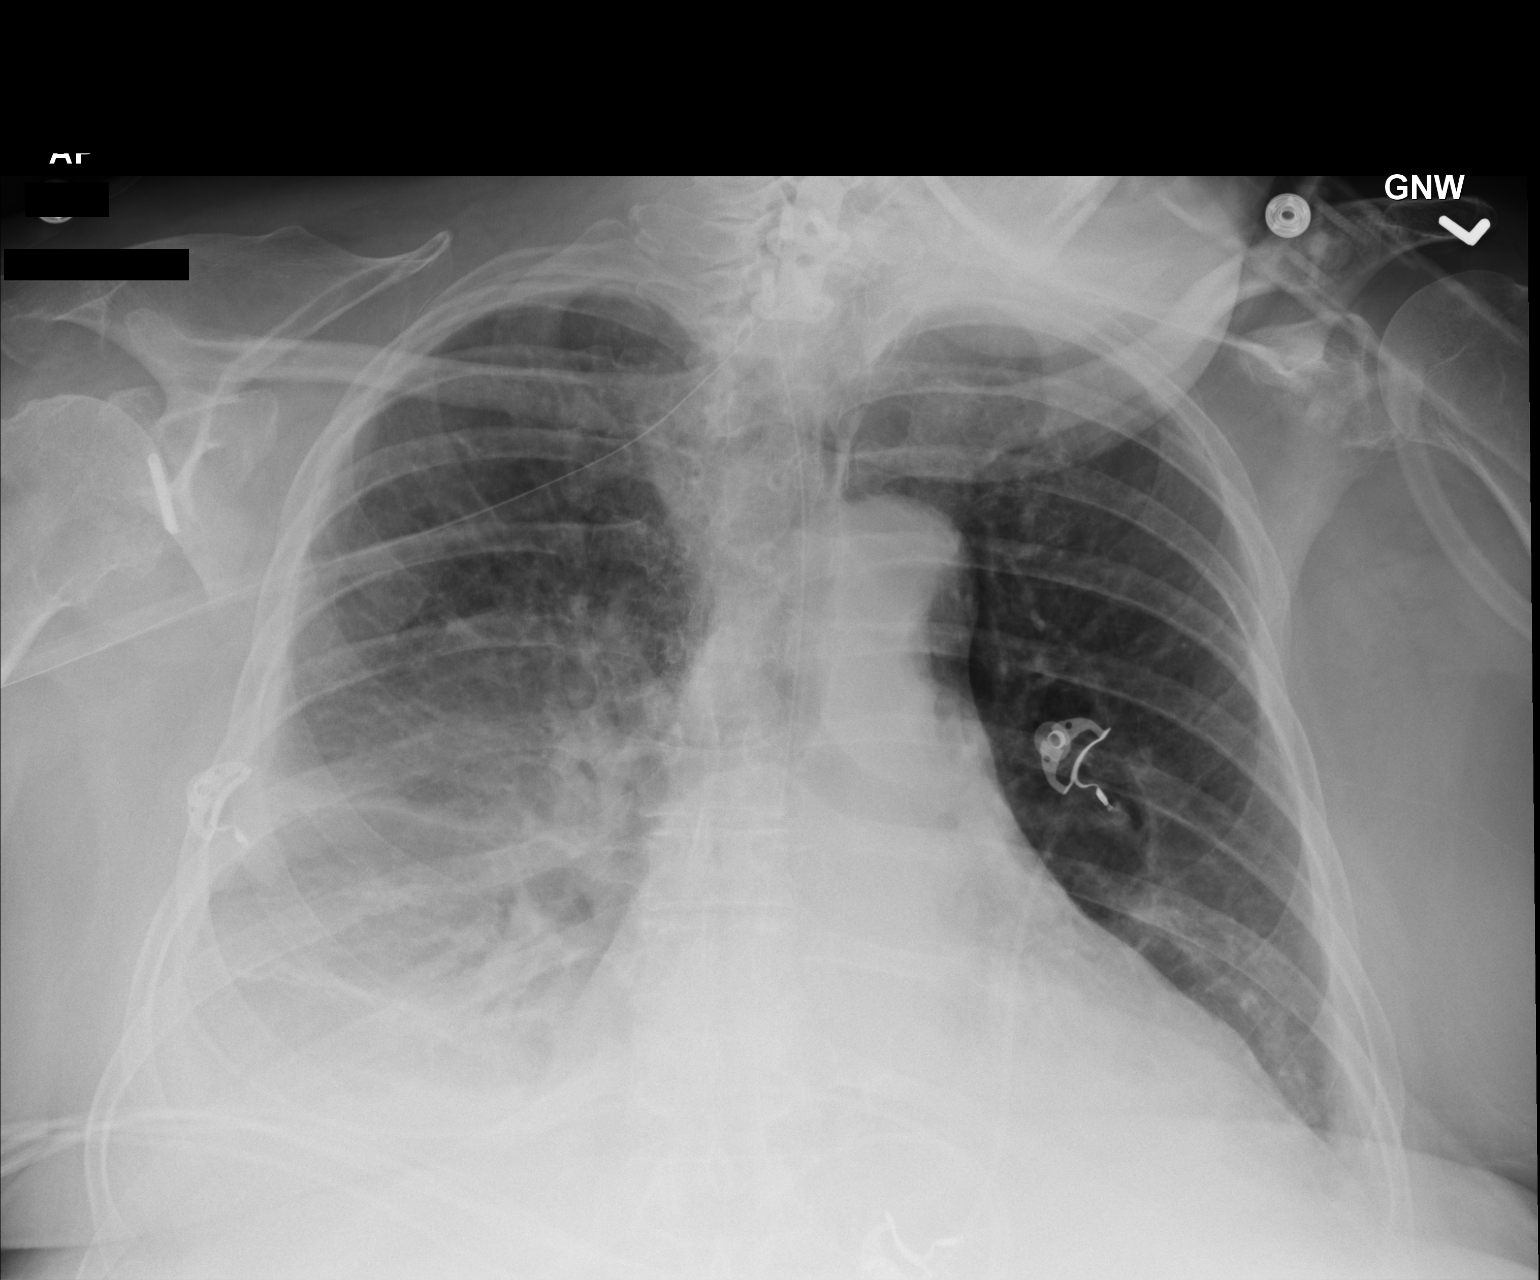

[1 of 1 positions shown; findings below may reference images not displayed]

FINDINGS: Left basilar consolidation and pleural effusion improved. Aeration
at the right base improved but there is persistent pleural effusion
and consolidation. NG tube placed. It is beyond the gastroesophageal
junction. Mild cardiomegaly.
IMPRESSION: Bibasilar consolidation and pleural effusion improved on the left
but stable on the right.
